# Patient Record
Sex: Male | Born: 1955 | Race: White | Hispanic: No | Marital: Married | Smoking: Never smoker
Health system: Southern US, Community
[De-identification: ages and names within clinical notes are randomized; demographics above are authoritative.]

## PROBLEM LIST (undated history)

## (undated) DIAGNOSIS — I2510 Atherosclerotic heart disease of native coronary artery without angina pectoris: Secondary | ICD-10-CM

## (undated) HISTORY — PX: CORONARY ANGIOPLASTY WITH STENT PLACEMENT: SHX49

---

## 1998-12-28 ENCOUNTER — Encounter: Payer: Self-pay | Admitting: Emergency Medicine

## 1998-12-28 ENCOUNTER — Emergency Department (HOSPITAL_COMMUNITY): Admission: EM | Admit: 1998-12-28 | Discharge: 1998-12-28 | Payer: Self-pay | Admitting: Emergency Medicine

## 2003-05-01 ENCOUNTER — Emergency Department (HOSPITAL_COMMUNITY): Admission: EM | Admit: 2003-05-01 | Discharge: 2003-05-01 | Payer: Self-pay | Admitting: Emergency Medicine

## 2004-09-27 ENCOUNTER — Other Ambulatory Visit: Payer: Self-pay

## 2004-09-27 ENCOUNTER — Inpatient Hospital Stay: Payer: Self-pay | Admitting: Cardiology

## 2004-11-09 ENCOUNTER — Ambulatory Visit: Payer: Self-pay | Admitting: Gastroenterology

## 2005-01-12 ENCOUNTER — Ambulatory Visit: Payer: Self-pay | Admitting: Family Medicine

## 2006-11-28 IMAGING — US ULTRASOUND AORTA
1 series · 10 of 10 positions shown · non-contrast
Comparison: none

REASON FOR EXAM: Family history of AAA
COMMENTS:

PROCEDURE:     US  - US AORTA  - January 12, 2005  [DATE]
RESULT:     The abdominal aorta is visualized and is normal in appearance.
Specifically, no aneurysm formation is seen. No periaortic mass is noted.
The distal abdominal aorta measures approximately 1.7 cm at maximum AP
diameter.

[Series 1: ultrasound aorta · 10 of 10 slices shown]
[im 1/10]
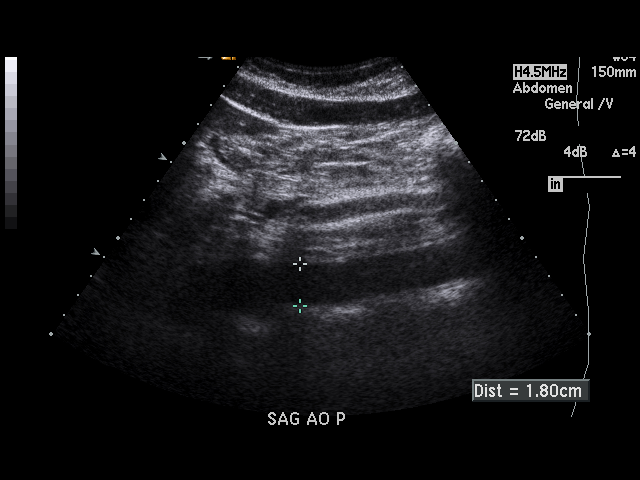
[im 2/10]
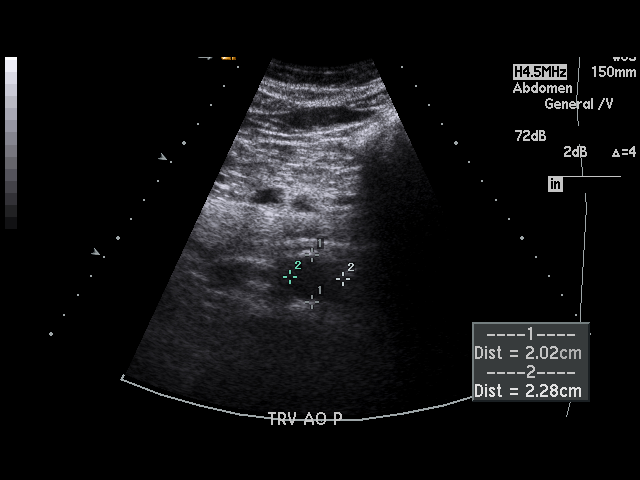
[im 3/10]
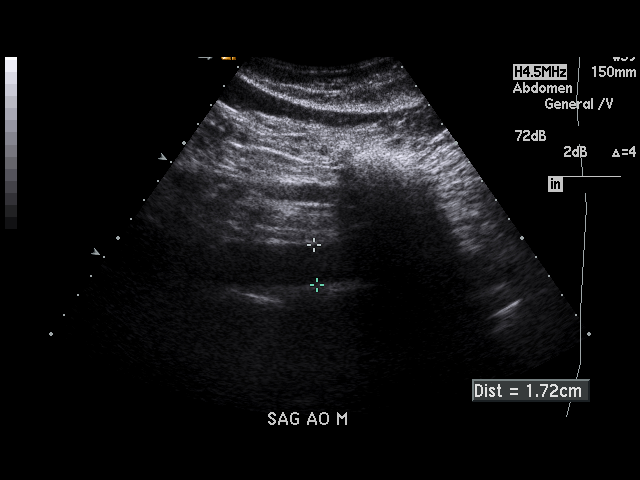
[im 4/10]
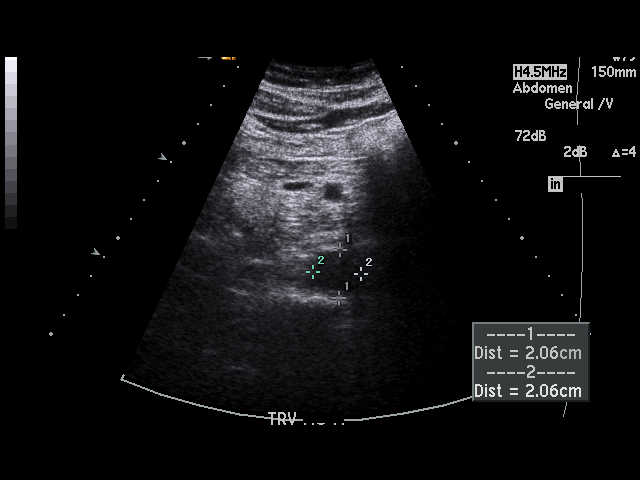
[im 5/10]
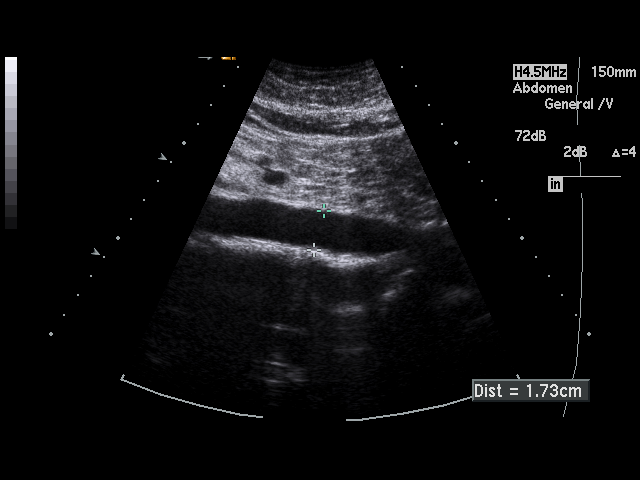
[im 6/10]
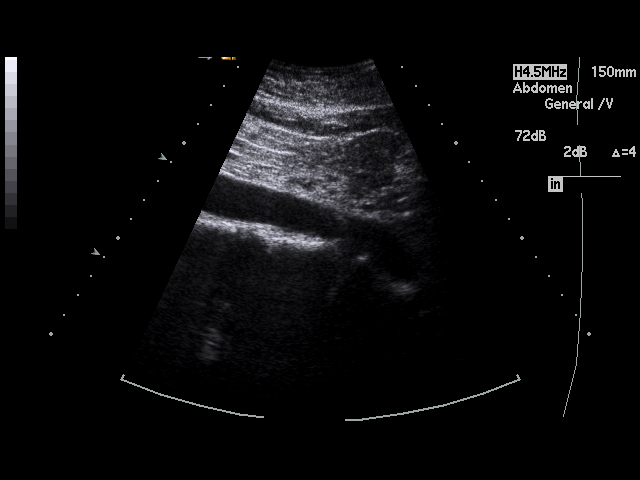
[im 7/10]
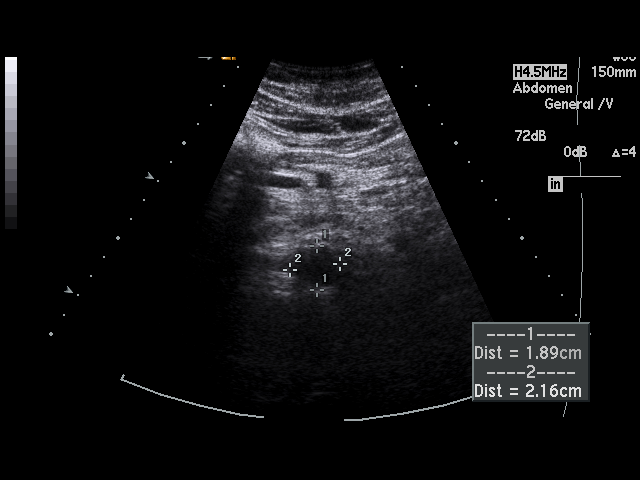
[im 8/10]
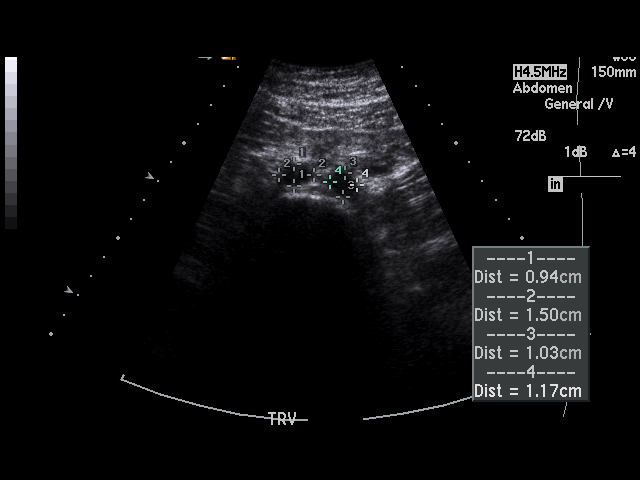
[im 9/10]
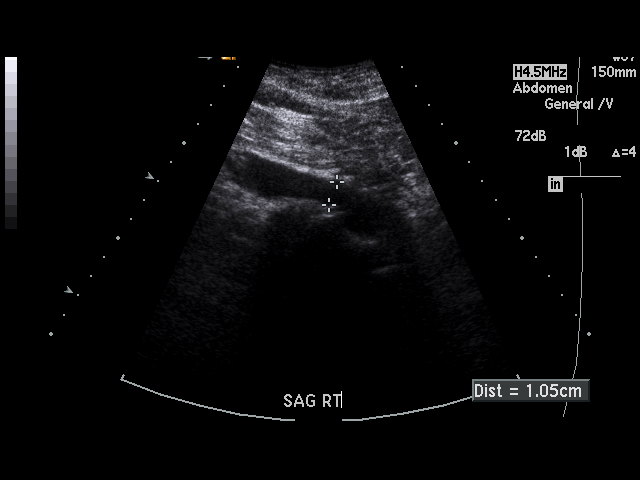
[im 10/10]
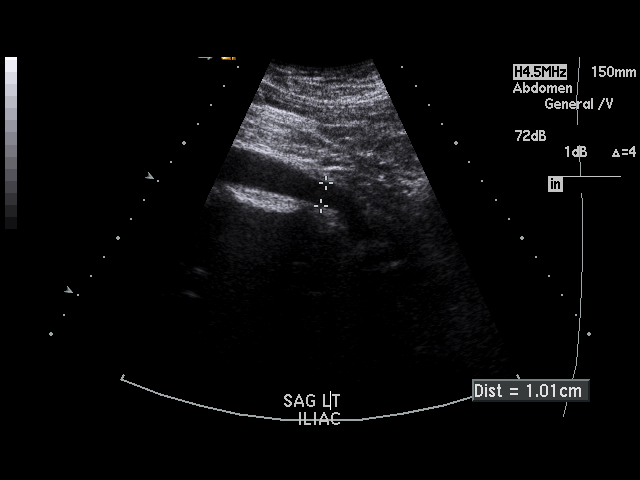

[10 of 10 positions shown; findings below may reference images not displayed]

IMPRESSION: No significant abnormalities are noted.

## 2008-01-07 DIAGNOSIS — I2510 Atherosclerotic heart disease of native coronary artery without angina pectoris: Secondary | ICD-10-CM | POA: Insufficient documentation

## 2008-09-29 ENCOUNTER — Ambulatory Visit: Payer: Self-pay | Admitting: Family Medicine

## 2009-02-09 DIAGNOSIS — E7800 Pure hypercholesterolemia, unspecified: Secondary | ICD-10-CM | POA: Insufficient documentation

## 2009-10-28 ENCOUNTER — Emergency Department: Payer: Self-pay | Admitting: Emergency Medicine

## 2010-02-08 ENCOUNTER — Emergency Department: Payer: Self-pay | Admitting: Emergency Medicine

## 2011-02-19 ENCOUNTER — Encounter (HOSPITAL_COMMUNITY): Payer: Self-pay

## 2011-02-19 ENCOUNTER — Emergency Department (HOSPITAL_COMMUNITY)
Admission: EM | Admit: 2011-02-19 | Discharge: 2011-02-19 | Disposition: A | Discharge status: Home / Self Care | Payer: BC Managed Care – PPO | Attending: Emergency Medicine | Admitting: Emergency Medicine

## 2011-02-19 DIAGNOSIS — H109 Unspecified conjunctivitis: Secondary | ICD-10-CM | POA: Insufficient documentation

## 2011-02-19 DIAGNOSIS — H579 Unspecified disorder of eye and adnexa: Secondary | ICD-10-CM | POA: Insufficient documentation

## 2011-02-19 DIAGNOSIS — H5789 Other specified disorders of eye and adnexa: Secondary | ICD-10-CM | POA: Insufficient documentation

## 2011-02-19 DIAGNOSIS — J3489 Other specified disorders of nose and nasal sinuses: Secondary | ICD-10-CM | POA: Insufficient documentation

## 2011-02-19 DIAGNOSIS — R509 Fever, unspecified: Secondary | ICD-10-CM | POA: Insufficient documentation

## 2011-02-19 DIAGNOSIS — R059 Cough, unspecified: Secondary | ICD-10-CM | POA: Insufficient documentation

## 2011-02-19 DIAGNOSIS — R05 Cough: Secondary | ICD-10-CM | POA: Insufficient documentation

## 2011-02-19 MED ORDER — TOBRAMYCIN 0.3 % OP SOLN
2.0000 [drp] | Freq: Once | OPHTHALMIC | Status: AC
  Administered 2011-02-19: 2 [drp] via OPHTHALMIC
  Filled 2011-02-19: qty 5

## 2011-02-19 NOTE — ED Notes (Signed)
Left eye drainage and itching.

## 2011-02-19 NOTE — ED Notes (Signed)
Presents with redness and swelling to left eye. Pt states he has had a head cold and yesterday eye began draining and itching. NAD at this time. Will continue to monitor.

## 2011-02-19 NOTE — ED Notes (Signed)
Pt a/ox4. Resp even and unlabored. NAD at this time. D/C instructions reviewed with pt. Pt verbalized understanding. Pt ambulated to lobby with steady gate.  

## 2011-02-19 NOTE — ED Provider Notes (Signed)
History     CSN: [id number]  Arrival date & time 02/19/11  1211   First MD Initiated Contact with Patient 02/19/11 1300      Chief Complaint  Patient presents with  . Eye Drainage    (Consider location/radiation/quality/duration/timing/severity/associated sxs/prior treatment) The history is provided by the patient.  pt states last week had uri symptoms w non productive cough, nasal congestion, fever. Those symptoms have improved/resolved. Now states in past 1-2 days, left eye redness, eyelash matting, drainage, irrigation. No severe eye pain. No change in vision. No headache. No known ill contacts. No eye trauma, chemical exposure or fb sensation. No contact use.   History reviewed. No pertinent past medical history.  Past Surgical History  Procedure Date  . Coronary angioplasty with stent placement     No family history on file.  History  Substance Use Topics  . Smoking status: Never Smoker   . Smokeless tobacco: Not on file  . Alcohol Use: No      Review of Systems  Constitutional: Negative for fever.  Eyes: Positive for discharge and itching. Negative for photophobia and pain.  Skin: Negative for rash.  Hematological: Negative for adenopathy.    Allergies  Review of patient's allergies indicates no known allergies.  Home Medications   Current Outpatient Rx  Name Route Sig Dispense Refill  . GUAIFENESIN ER 600 MG PO TB12 Oral Take 1,200 mg by mouth 2 (two) times daily as needed. For congestion    . PSEUDOEPHEDRINE HCL 30 MG PO TABS Oral Take 30 mg by mouth every 4 (four) hours as needed. For congestion      BP 129/88  Pulse 69  Temp(Src) 98.1 F (36.7 C) (Oral)  Resp 22  Ht 5\' 7"  (1.702 m)  Wt 170 lb (77.111 kg)  BMI 26.63 kg/m2  SpO2 98%  Physical Exam  Nursing note and vitals reviewed. Constitutional: He is oriented to person, place, and time. He appears well-developed and well-nourished. No distress.  HENT:  Head: Atraumatic.  Eyes: Pupils are  equal, round, and reactive to light. Right eye exhibits no discharge. Left eye exhibits discharge. No scleral icterus.       Left conj injected, eyelash matting. No orbital or periorbital cellulitis. No corneal clouding. Lids everted, no fb. Eomi, no pain w eom. No rash/vesicles or lesions.   Neck: Neck supple. No tracheal deviation present.  Cardiovascular: Normal rate, normal heart sounds and intact distal pulses.  Exam reveals no gallop and no friction rub.   No murmur heard. Pulmonary/Chest: Effort normal and breath sounds normal. No accessory muscle usage. No respiratory distress.  Musculoskeletal: Normal range of motion.  Neurological: He is alert and oriented to person, place, and time.  Skin: Skin is warm and dry.  Psychiatric: He has a normal mood and affect.    ED Course  Procedures (including critical care time)  Labs Reviewed - No data to display    MDM  tobrex gtts.  Confirmed nkda w pt.        Suzi Roots, MD 02/19/11 1324

## 2011-09-13 IMAGING — CT CT ABD-PELV W/ CM
1 of 2 series · 15 of 32 positions shown, 19 images · non-contrast
Comparison: none

REASON FOR EXAM: (1) right lower quad abdominal pain; (2) right lower
quad abdominal pain
COMMENTS:

PROCEDURE:     CT  - CT ABDOMEN / PELVIS  W  - October 28, 2009  [DATE]
RESULT:     CT abdomen and pelvis dated 10/28/2009.
TECHNIQUE: Helical 3 mm sections were obtained from the lung bases through
the pubic symphysis status post intravenous administration of 100 mL of
Hsovue-MDR and oral contrast.

[Series 2: appendicitis · axial · 0.69mm/px · z∈[+398,+872]mm · 15 of 172 slices shown, 19 images]
[im 7/172  soft-tissue]
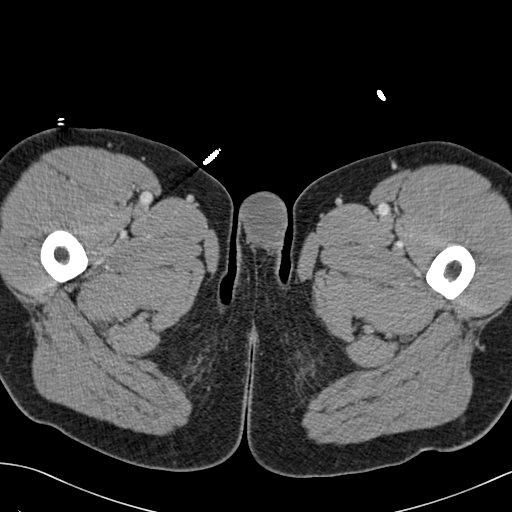
[im 7/172  bone]
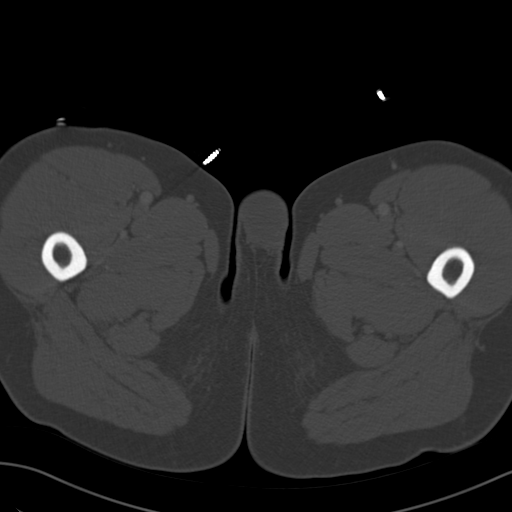
[im 21/172  soft-tissue]
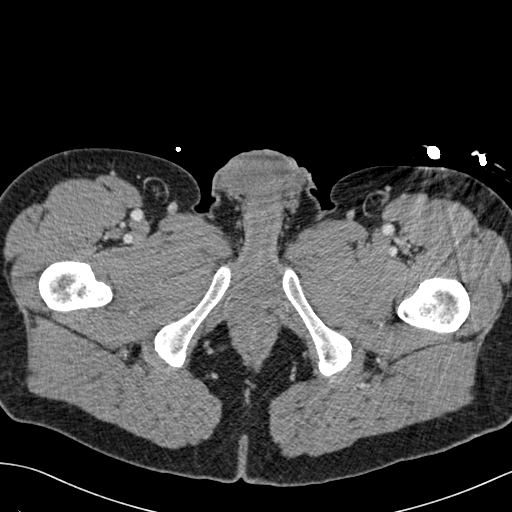
[im 35/172  soft-tissue]
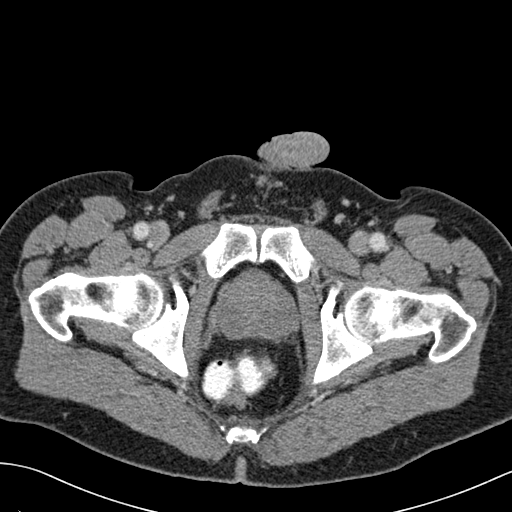
[im 48/172  soft-tissue]
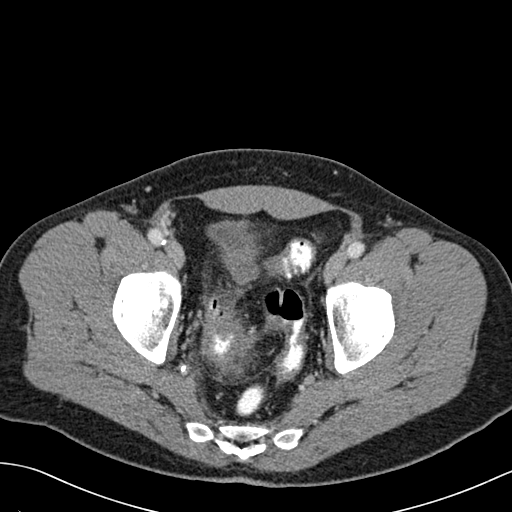
[im 62/172  soft-tissue]
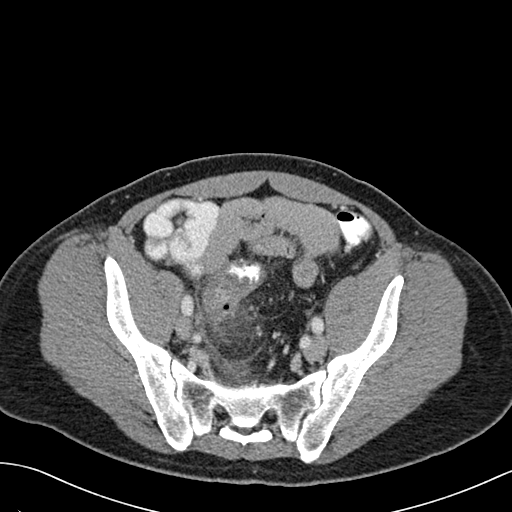
[im 76/172  soft-tissue]
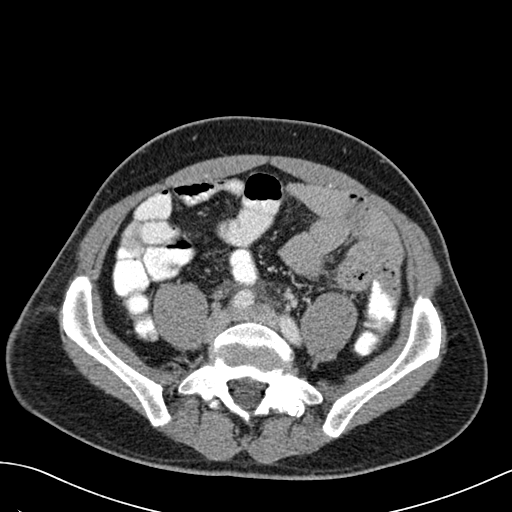
[im 89/172  soft-tissue]
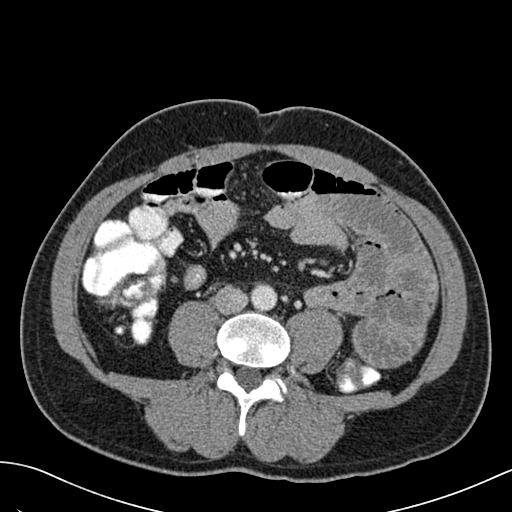
[im 96/172  soft-tissue]
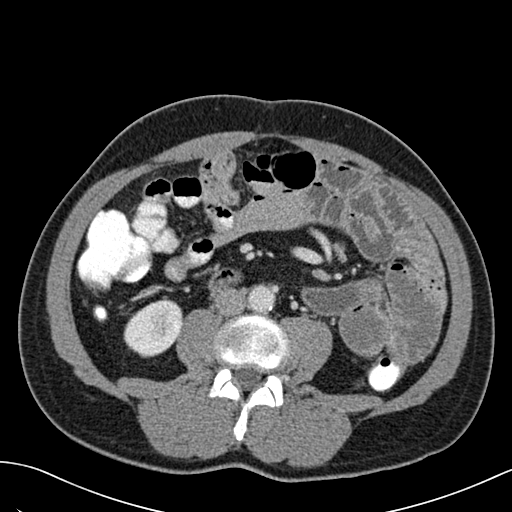
[im 110/172  soft-tissue]
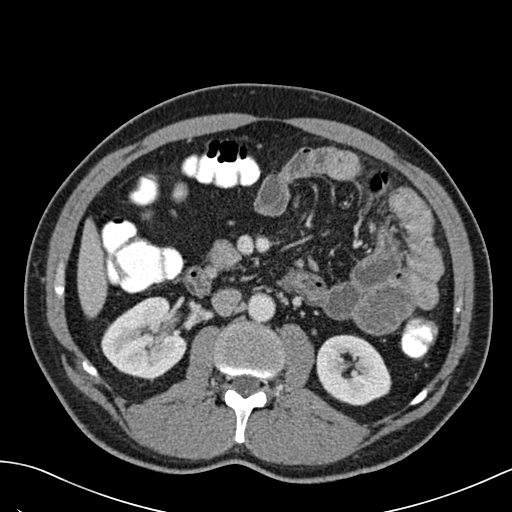
[im 110/172  bone]
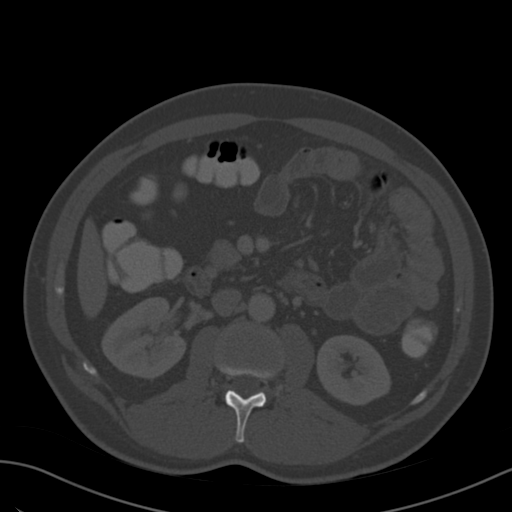
[im 124/172  soft-tissue]
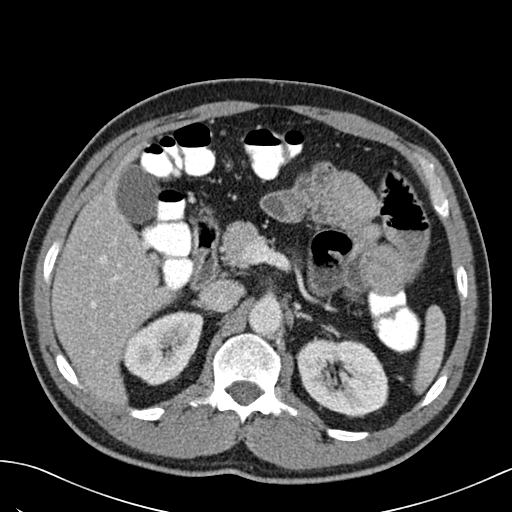
[im 137/172  soft-tissue]
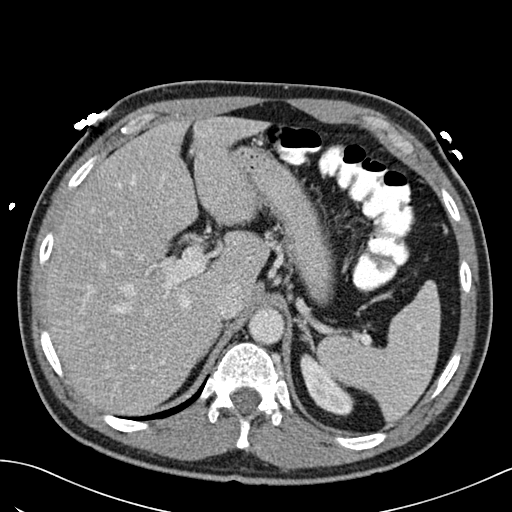
[im 144/172  lung]
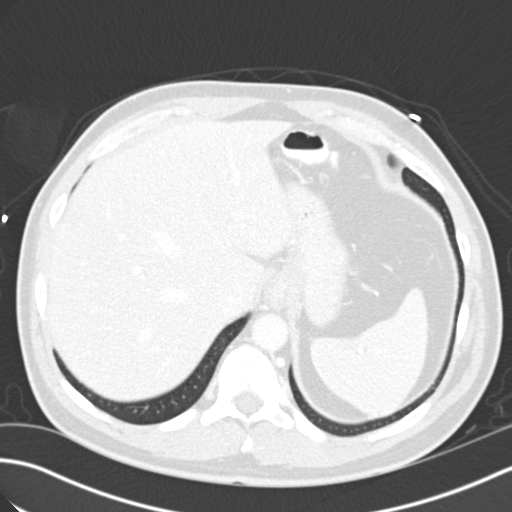
[im 151/172  soft-tissue]
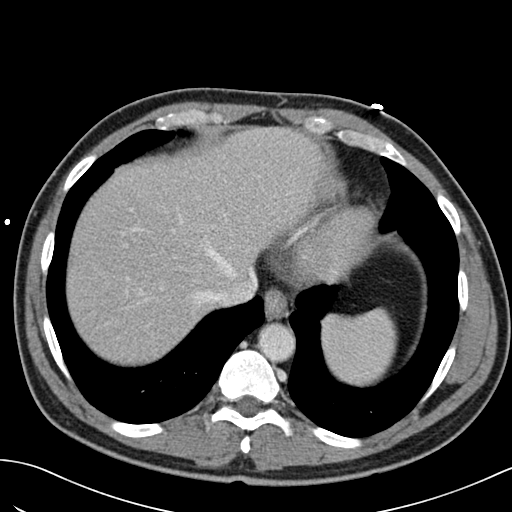
[im 151/172  lung]
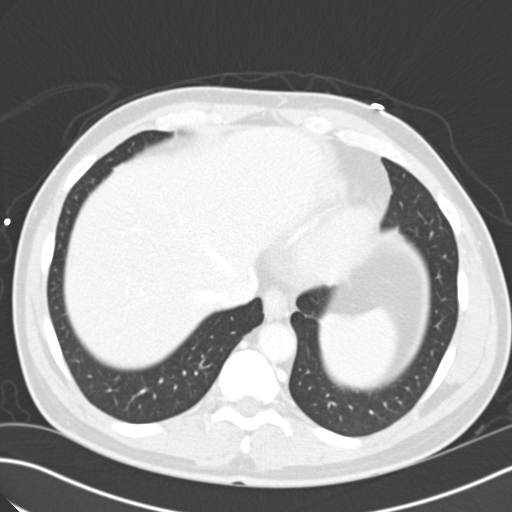
[im 158/172  lung]
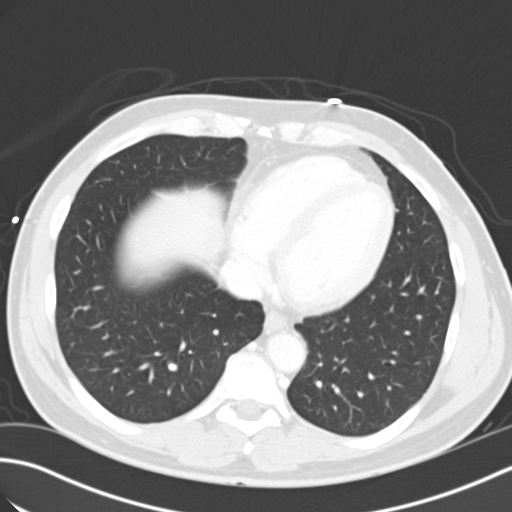
[im 165/172  soft-tissue]
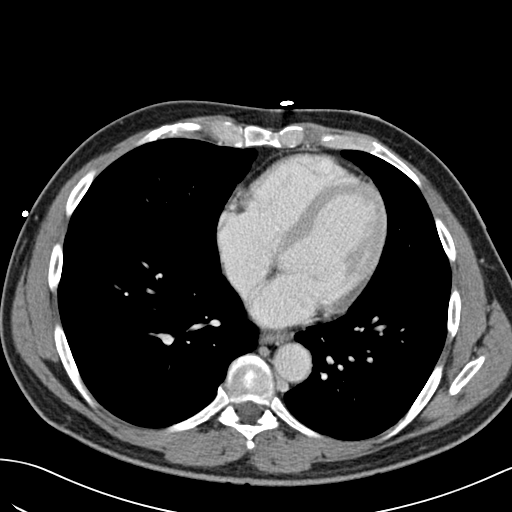
[im 165/172  lung]
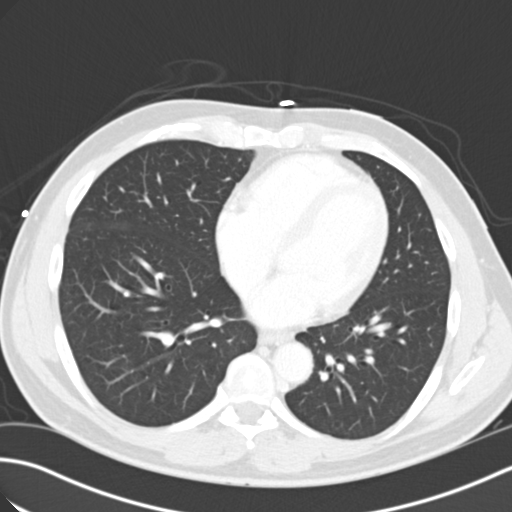

[15 of 32 positions shown; findings below may reference images not displayed]

FINDINGS: The lung bases demonstrate no gross abnormalities.

The liver, spleen, adrenals, pancreas, kidneys are unremarkable.

There is no evidence of abdominal aortic aneurysm. There is no evidence of
abdominal or pelvic masses nor adenopathy.

Within the right paracentral base of the pelvis there is an area of diffuse
bowel wall thickening involving the mid sigmoid colon. Induration within the
surrounding mesenteric fat is appreciated as well as a small amount of free
fluid. No loculated fluid collections are associated with this finding.
There is diverticulosis involving the sigmoid colon which is mild to
moderate. There is no evidence of associated free air. There are
fluid-filled loops of small bowel within the left upper quadrant and areas
of mild bowel wall thickening. There is otherwise no further evidence to
suggest bowel obstruction. There is otherwise no evidence of abdominal or
pelvic masses or adenopathy.
IMPRESSION: 1. CT findings consistent with a diverticulitis involving the sigmoid colon.
Repeat surveillance evaluation is recommended status post appropriate
therapeutic regiment to ensure resolution of the bowel wall thickening.
There is no associated free air nor loculated fluid collections to suggest
an abscess.
2. Findings like representing an ileus [REDACTED]s secondary to the sigmoid
colon findings. A concomitant mild enteritis is also diagnostic concern
infectious versus inflammatory, reactive.
3. Dr. Roberto of the emergency department was informed of these findings at
the time of initial interpretation.

## 2013-09-17 ENCOUNTER — Ambulatory Visit: Payer: Self-pay

## 2013-09-17 ENCOUNTER — Other Ambulatory Visit: Payer: Self-pay | Admitting: Occupational Medicine

## 2013-09-17 DIAGNOSIS — R52 Pain, unspecified: Secondary | ICD-10-CM

## 2015-07-16 DIAGNOSIS — K219 Gastro-esophageal reflux disease without esophagitis: Secondary | ICD-10-CM | POA: Insufficient documentation

## 2015-07-16 DIAGNOSIS — N400 Enlarged prostate without lower urinary tract symptoms: Secondary | ICD-10-CM | POA: Insufficient documentation

## 2015-07-31 DIAGNOSIS — R0602 Shortness of breath: Secondary | ICD-10-CM | POA: Insufficient documentation

## 2016-08-03 DIAGNOSIS — R7989 Other specified abnormal findings of blood chemistry: Secondary | ICD-10-CM | POA: Insufficient documentation

## 2017-10-25 ENCOUNTER — Ambulatory Visit: Admit: 2017-10-25 | Payer: Self-pay | Admitting: Internal Medicine

## 2017-10-25 SURGERY — ESOPHAGOGASTRODUODENOSCOPY (EGD) WITH PROPOFOL
Anesthesia: General

## 2021-08-11 ENCOUNTER — Ambulatory Visit
Admission: EM | Admit: 2021-08-11 | Discharge: 2021-08-11 | Disposition: A | Discharge status: Home / Self Care | Payer: Medicare HMO

## 2021-08-11 ENCOUNTER — Encounter: Payer: Self-pay | Admitting: Emergency Medicine

## 2021-08-11 DIAGNOSIS — S99922A Unspecified injury of left foot, initial encounter: Secondary | ICD-10-CM

## 2021-08-11 DIAGNOSIS — S8991XA Unspecified injury of right lower leg, initial encounter: Secondary | ICD-10-CM

## 2021-08-11 NOTE — Discharge Instructions (Signed)
Go to [facility]! They close at 9pm

## 2021-08-11 NOTE — ED Triage Notes (Signed)
Pt here after twisting right knee after stepping on an uneven surface and catching his left heel today. The right knee is swollen and the left heel is painful.

## 2021-08-11 NOTE — ED Provider Notes (Signed)
Renaldo Fiddler    CSN: [id number] Arrival date & time: 08/11/21  1843      History   Chief Complaint Chief Complaint  Patient presents with  . Knee Pain  . Foot Pain    HPI Johnny Newman is a 66 y.o. male. Pt here after twisting right knee after stepping on an uneven surface and catching his left heel today. The right knee is swollen and the left heel is painful.  Denies falling.  Denies any other injury.  Is able to bear weight but it is painful.   Knee Pain Foot Pain   History reviewed. No pertinent past medical history.  There are no problems to display for this patient.   Past Surgical History:  Procedure Laterality Date  . CORONARY ANGIOPLASTY WITH STENT PLACEMENT         Home Medications    Prior to Admission medications   Medication Sig Start Date End Date Taking? Authorizing Provider  guaiFENesin (MUCINEX) 600 MG 12 hr tablet Take 1,200 mg by mouth 2 (two) times daily as needed. For congestion    [provider]  pseudoephedrine (SUDAFED) 30 MG tablet Take 30 mg by mouth every 4 (four) hours as needed. For congestion    [provider]    Family History History reviewed. No pertinent family history.  Social History Social History   Tobacco Use  . Smoking status: Never  Substance Use Topics  . Alcohol use: No  . Drug use: No     Allergies   Patient has no known allergies.   Review of Systems Review of Systems   Physical Exam Triage Vital Signs ED Triage Vitals  Enc Vitals Group     BP 08/11/21 1857 138/85     Pulse Rate 08/11/21 1857 88     Resp 08/11/21 1857 18     Temp 08/11/21 1857 98.4 F (36.9 C)     Temp src --      SpO2 08/11/21 1857 97 %     Weight --      Height --      Head Circumference --      Peak Flow --      Pain Score 08/11/21 1914 6     Pain Loc --      Pain Edu? --      Excl. in GC? --    No data found.  Updated Vital Signs BP 138/85   Pulse 88   Temp 98.4 F (36.9 C)    Resp 18   SpO2 97%   Visual Acuity Right Eye Distance:   Left Eye Distance:   Bilateral Distance:    Right Eye Near:   Left Eye Near:    Bilateral Near:     Physical Exam Constitutional:      Appearance: Normal appearance.  Pulmonary:     Effort: Pulmonary effort is normal.  Musculoskeletal:     Right knee: Swelling present. No bony tenderness. Normal range of motion. Tenderness present.     Left knee: Normal.     Comments: Laxity and pain with movement of the knee  Neurological:     Mental Status: He is alert.     UC Treatments / Results  Labs (all labs ordered are listed, but only abnormal results are displayed) Labs Reviewed - No data to display  EKG   Radiology No results found.  Procedures Procedures (including critical care time)  Medications Ordered in UC Medications - No data  to display  Initial Impression / Assessment and Plan / UC Course  I have reviewed the triage vital signs and the nursing notes.  Pertinent labs & imaging results that were available during my care of the patient were reviewed by me and considered in my medical decision making (see chart for details).    [facility] has been walking.  Symptomatic left tonight.  Patient directed there for further evaluation.  I do not suspect anything is broken.  I do suspect he may have torn 1 or more ligaments.  Final Clinical Impressions(s) / UC Diagnoses   Final diagnoses:  Knee injury, right, initial encounter  Injury of left heel, initial encounter     Discharge Instructions      Go to [facility] tonight! They close at 9pm   ED Prescriptions   None    PDMP not reviewed this encounter.   Cathlyn Parsons, NP 08/11/21 1954

## 2021-08-30 DIAGNOSIS — M2391 Unspecified internal derangement of right knee: Secondary | ICD-10-CM | POA: Insufficient documentation

## 2021-08-30 DIAGNOSIS — M79672 Pain in left foot: Secondary | ICD-10-CM | POA: Insufficient documentation

## 2021-08-30 DIAGNOSIS — M722 Plantar fascial fibromatosis: Secondary | ICD-10-CM | POA: Insufficient documentation

## 2021-09-07 ENCOUNTER — Ambulatory Visit (INDEPENDENT_AMBULATORY_CARE_PROVIDER_SITE_OTHER): Payer: No Typology Code available for payment source | Admitting: Podiatry

## 2021-09-07 ENCOUNTER — Ambulatory Visit (INDEPENDENT_AMBULATORY_CARE_PROVIDER_SITE_OTHER): Payer: No Typology Code available for payment source

## 2021-09-07 DIAGNOSIS — M722 Plantar fascial fibromatosis: Secondary | ICD-10-CM

## 2021-09-07 MED ORDER — METHYLPREDNISOLONE 4 MG PO TBPK: ORAL_TABLET | ORAL | 0 refills | Status: DC

## 2021-09-07 MED ORDER — MELOXICAM 15 MG PO TABS: 15.0000 mg | ORAL_TABLET | Freq: Every day | ORAL | 1 refills | Status: DC

## 2021-09-07 MED ORDER — BETAMETHASONE SOD PHOS & ACET 6 (3-3) MG/ML IJ SUSP: 3.0000 mg | Freq: Once | INTRAMUSCULAR | Status: AC

## 2021-09-07 NOTE — Progress Notes (Signed)
   Chief Complaint  Patient presents with   Foot Pain    Left heel pain.    Subjective: 66 y.o. male presenting today for evaluation of left heel pain secondary to an injury that occurred while working.  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  DOI: 08/11/2021.  Patient states that he has had pain and tenderness ever since.  He has been wearing a night splint with minimal relief or improvement.  He presents for further treatment and evaluation  No past medical history on file.   Objective: Physical Exam General: The patient is alert and oriented x3 in no acute distress.  Dermatology: Skin is warm, dry and supple bilateral lower extremities. Negative for open lesions or macerations bilateral.   Vascular: Dorsalis Pedis and Posterior Tibial pulses palpable bilateral.  Capillary fill time is immediate to all digits.  Neurological: Epicritic and protective threshold intact bilateral.   Musculoskeletal: Tenderness to palpation to the plantar aspect of the left heel along the plantar fascia. All other joints range of motion within normal limits bilateral. Strength 5/5 in all groups bilateral.   Radiographic exam: Normal osseous mineralization. Joint spaces preserved. No fracture/dislocation/boney destruction. No other soft tissue abnormalities or radiopaque foreign bodies.   Assessment: 1. Plantar fasciitis left foot secondary to work-related injury  Plan of Care:  1. Patient evaluated. Xrays reviewed.   2. Injection of 0.5cc Celestone soluspan injected into the left plantar fascia.  3. Rx for Medrol Dose Pak placed 4. Rx for Meloxicam ordered for patient. 5. Plantar fascial band(s) dispensed  6. Instructed patient regarding therapies and modalities at home to alleviate symptoms.  Continue night splint nightly  7.  Return to clinic in 4 weeks.    [employer] #[id number]---Adjuster is Raiford Noble.   Felecia Shelling, DPM [facility]  Dr. Felecia Shelling, DPM    [address] [place], [place] [address]                Office [phone]  Fax [phone]

## 2021-09-08 ENCOUNTER — Telehealth: Payer: Self-pay | Admitting: Podiatry

## 2021-09-08 NOTE — Telephone Encounter (Signed)
Noted! Thank you

## 2021-09-08 NOTE — Telephone Encounter (Signed)
Patient came in to the office today stating he was seen yesterday by Dr Logan Bores and he spoke with Dr Logan Bores about this being a workmans comp. Patient brought his workmans comp information in for Dr Logan Bores to put in his chart. Patient states he is billing under his insurance. [employer] Claim #[id number]---Adjuster is Raiford Noble.

## 2021-10-05 ENCOUNTER — Ambulatory Visit: Payer: Medicare HMO | Admitting: Podiatry

## 2021-10-05 DIAGNOSIS — M722 Plantar fascial fibromatosis: Secondary | ICD-10-CM | POA: Diagnosis not present

## 2021-10-05 NOTE — Progress Notes (Signed)
   Chief Complaint  Patient presents with   Foot Pain    Patient is here for left heel pain, patient states that he is still having the same pain.    Subjective: 66 y.o. male presenting today for follow-up evaluation of left heel pain secondary to an injury that occurred while working.  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  DOI: 08/11/2021.    Patient continues to have pain and tenderness.  He says the injection dulled the pain for only about 1 day.  He says that around day 3 the pain actually increased.  He continues to have an antalgic gait with pain with ambulation to the left heel.  No past medical history on file.   Objective: Physical Exam General: The patient is alert and oriented x3 in no acute distress.  Dermatology: Skin is warm, dry and supple bilateral lower extremities. Negative for open lesions or macerations bilateral.   Vascular: Dorsalis Pedis and Posterior Tibial pulses palpable bilateral.  Capillary fill time is immediate to all digits.  Neurological: Epicritic and protective threshold intact bilateral.   Musculoskeletal: There continues to be moderate to severe tenderness to palpation to the plantar aspect of the left heel along the plantar fascia. All other joints range of motion within normal limits bilateral. Strength 5/5 in all groups bilateral.   Radiographic exam LT foot 09/07/2021: Normal osseous mineralization. Joint spaces preserved. No fracture/dislocation/boney destruction. No other soft tissue abnormalities or radiopaque foreign bodies.   Assessment: 1. Plantar fasciitis left foot secondary to work-related injury  Plan of Care:  1. Patient evaluated.  2.  The patient did not get any significant relief from the cortisone injection or prednisone pack or plantar fascial bracing 3.   Continue the meloxicam 15 mg daily 4.  For now we are going to place the patient in the immobilization cam boot x3 weeks.  Weightbearing as tolerated. 5.  Return to clinic in 3 weeks.  If there is improvement and reduction of pain and inflammation we will send the patient to physical therapy.   [employer] #[id number]---Adjuster is Johnny Newman.   Johnny Newman, DPM [facility]  Dr. Edrick Newman, DPM    [address], [place] [address]                Office [phone]  Fax [phone]

## 2021-10-26 ENCOUNTER — Ambulatory Visit (INDEPENDENT_AMBULATORY_CARE_PROVIDER_SITE_OTHER): Payer: No Typology Code available for payment source | Admitting: Podiatry

## 2021-10-26 DIAGNOSIS — M722 Plantar fascial fibromatosis: Secondary | ICD-10-CM | POA: Diagnosis not present

## 2021-10-26 NOTE — Progress Notes (Signed)
   Chief Complaint  Patient presents with   Follow-up    Patient is her for follow-up left foot plantar fasciitis.Patient states that he is still having some pain.    Subjective: 66 y.o. male presenting today for follow-up evaluation of left heel pain secondary to an injury that occurred while working.  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  DOI: 08/11/2021.    Patient continues to have pain and tenderness.  Patient states that he wore the immobilization cam boot the majority of the time over the past 3 weeks.  He continues to have some pain and tenderness to the heel.  Only slight improvement.  He also takes the meloxicam daily.  No new complaints at this time  No past medical history on file.   Objective: Physical Exam General: The patient is alert and oriented x3 in no acute distress.  Dermatology: Skin is warm, dry and supple bilateral lower extremities. Negative for open lesions or macerations bilateral.   Vascular: Dorsalis Pedis and Posterior Tibial pulses palpable bilateral.  Capillary fill time is immediate to all digits.  Neurological: Epicritic and protective threshold intact bilateral.   Musculoskeletal: There continues to be moderate to severe tenderness to palpation to the plantar aspect of the left heel along the plantar fascia. All other joints range of motion within normal limits bilateral. Strength 5/5 in all groups bilateral.   Radiographic exam LT foot 09/07/2021: Normal osseous mineralization. Joint spaces preserved. No fracture/dislocation/boney destruction. No other soft tissue abnormalities or radiopaque foreign bodies.   Assessment: 1. Plantar fasciitis left foot secondary to work-related injury  Plan of Care:  1. Patient evaluated.  2.  Overall the patient has only had minimal relief with  the immobilization in the cam boot. 3.  Today we are going to initiate physical therapy.  Order placed for physical therapy at [facility] 4.  Continue meloxicam 15 mg daily 5.  Patient may transition out of the cam boot into good supportive tennis shoes 6.  Return to clinic 6 weeks   [place] #[id number]---Adjuster is Lu Duffel.   Edrick Kins, DPM [facility]  Dr. Edrick Kins, DPM    [address], [place] [address]                Office [phone]  Fax [phone]

## 2021-11-07 ENCOUNTER — Other Ambulatory Visit: Payer: Self-pay | Admitting: Podiatry

## 2022-01-05 ENCOUNTER — Other Ambulatory Visit: Payer: Self-pay | Admitting: Podiatry

## 2022-01-14 ENCOUNTER — Ambulatory Visit (INDEPENDENT_AMBULATORY_CARE_PROVIDER_SITE_OTHER): Payer: No Typology Code available for payment source | Admitting: Podiatry

## 2022-01-14 DIAGNOSIS — M722 Plantar fascial fibromatosis: Secondary | ICD-10-CM

## 2022-01-14 NOTE — Progress Notes (Unsigned)
Minimally better after pt at [facility] left heel Rtc after mri    Chief Complaint  Patient presents with   Follow-up    Minimal improvement after PT     Subjective: 67 y.o. male presenting today for follow-up evaluation of left heel pain secondary to an injury that occurred while working.  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  DOI: 08/11/2021.    Patient continues to have pain and tenderness.  He completed physical therapy but there has been minimal improvement. Presenting for further treatment and evaluation.  No past medical history on file.  Past Surgical History:  Procedure Laterality Date   CORONARY ANGIOPLASTY WITH STENT PLACEMENT     No Known Allergies  Objective: Physical Exam General: The patient is alert and oriented x3 in no acute distress.  Dermatology: Skin is warm, dry and supple bilateral lower extremities. Negative for open lesions or macerations bilateral.   Vascular: Dorsalis Pedis and Posterior Tibial pulses palpable bilateral.  Capillary fill time is immediate to all digits.  Neurological: Light touch and protective threshold intact bilateral.   Musculoskeletal: There continues to be moderate to severe tenderness to palpation to the plantar aspect of the left heel along the plantar fascia. All other joints range of motion within normal limits bilateral. Strength 5/5 in all groups bilateral.   Radiographic exam LT foot 09/07/2021: Normal osseous mineralization. Joint spaces preserved. No fracture/dislocation/boney destruction. No other soft tissue abnormalities or radiopaque foreign bodies.   Assessment: 1. Plantar fasciitis left foot secondary to work-related injury -Patient evaluated -Patient has not completed physical therapy and there has been minimal improvement or relief of  symptoms.  He continues to have pain on a daily basis -Continue meloxicam 15 mg daily as needed -Patient may now discontinue the immobilization cam boot -Order placed for MRI left heel without contrast -Return to clinic after MRI to review results and discuss further treatment options  [employer] Claim #[id number]---Adjuster is [employer].   Edrick Kins, DPM [facility]  Dr. Edrick Kins, DPM    [address], [place] [address]                Office [phone]  Fax [phone]

## 2022-02-17 ENCOUNTER — Ambulatory Visit
Admission: RE | Admit: 2022-02-17 | Discharge: 2022-02-17 | Disposition: A | Discharge status: Home / Self Care | Payer: No Typology Code available for payment source | Source: Ambulatory Visit | Attending: Podiatry | Admitting: Podiatry

## 2022-02-17 DIAGNOSIS — M722 Plantar fascial fibromatosis: Secondary | ICD-10-CM

## 2022-02-22 ENCOUNTER — Ambulatory Visit (INDEPENDENT_AMBULATORY_CARE_PROVIDER_SITE_OTHER): Payer: No Typology Code available for payment source | Admitting: Podiatry

## 2022-02-22 DIAGNOSIS — M722 Plantar fascial fibromatosis: Secondary | ICD-10-CM

## 2022-02-22 NOTE — Progress Notes (Signed)
Chief Complaint  Patient presents with   Plantar Fasciitis    Patient came in today for a left foot plantar fasciitis follow-up, MRI Results,     Subjective: 67 y.o. male presenting today for follow-up evaluation of left heel pain secondary to an injury that occurred while working.  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  DOI: 08/11/2021.    Patient continues to have pain and tenderness.  He completed physical therapy but there has been minimal improvement.  Patient continues to have pain or tenderness on a daily basis.  Presenting today to review the MRI results and discuss further treatment options   No past medical history on file.  Past Surgical History:  Procedure Laterality Date   CORONARY ANGIOPLASTY WITH STENT PLACEMENT     No Known Allergies  Objective: Physical Exam General: The patient is alert and oriented x3 in no acute distress.  Dermatology: Skin is warm, dry and supple bilateral lower extremities. Negative for open lesions or macerations bilateral.   Vascular: Dorsalis Pedis and Posterior Tibial pulses palpable bilateral.  Capillary fill time is immediate to all digits.  Neurological: Light touch and protective threshold intact bilateral.   Musculoskeletal: There continues to be moderate to severe tenderness to palpation to the plantar aspect of the left heel along the plantar fascia. All other joints range of motion within normal limits bilateral. Strength 5/5 in all groups bilateral.   Radiographic exam LT foot 09/07/2021: Normal osseous mineralization. Joint spaces preserved. No fracture/dislocation/boney destruction. No other soft tissue abnormalities or radiopaque foreign bodies.   MR HEEL LEFT WO CONTRAST 02/17/2022 Plantar Fascia: Severe thickening of the medial band of the plantar fascia  with increased signal consistent with severe plantar fasciitis with a partial-thickness tear towards the calcaneal insertion. IMPRESSION: 1. Severe plantar fasciitis of the medial band of the plantar fascia with a partial-thickness tear towards the calcaneal insertion. 2. Mild tendinosis of the peroneus brevis. 3. High-grade partial-thickness cartilage loss of the medial aspect of the tibial plafond and with subchondral cystic changes. High-grade partial-thickness cartilage loss of the lateral tibiotalar joint with subchondral reactive marrow edema.  Assessment: 1. Plantar fasciitis left foot secondary to work-related injury -Patient evaluated.  MRI reviewed - Unfortunate the patient continues to have pain and tenderness to the plantar heel for over 6 months.  Conservative treatments have been unsuccessful in providing any sort of lasting alleviation of symptoms for the patient.  I do believe it is appropriate at this time to discuss surgical endoscopic plantar fasciotomy to release the plantar fascia and allow for healing.  Risk benefits advantages and disadvantages were explained in detail to the patient.  No guarantees were expressed or implied.  This operative recovery course was also explained.  He will be WBAT cam boot x 3-4 weeks and then slowly transition back into good supportive tennis shoes or sneakers -Authorization for surgery was initiated today.  Surgery will consist of endoscopic plantar fasciotomy left with possible injectable amnion administration -Return to clinic 1 week postop  [place] Claim #[id number]---Adjuster is Lu Duffel.   Edrick Kins, DPM [facility]  Dr. Edrick Kins, DPM    [address]  [place], [place] [address]                Office [phone]  Fax [phone]

## 2022-03-16 ENCOUNTER — Other Ambulatory Visit: Payer: Self-pay | Admitting: Podiatry

## 2022-04-01 ENCOUNTER — Telehealth: Payer: Self-pay | Admitting: Urology

## 2022-04-01 NOTE — Telephone Encounter (Signed)
DOS - 04/14/22  EPF LEFT --- [id number]  WORKERS COMP  SPOKE WITH DEREK JOHNSON [employer] ADJUSTER HE STATED THAT PT HAS BEEN APPROVED TO HAVE SURGERY. HE WILL BE FAXING OVER PAPERS THAT I WILL THEN EMAIL TO [place] WITH [place].  CLAIM # [id number] - 0001  CALL REF # DEREK 04/01/22 AT 9:23 AM EST

## 2022-04-14 ENCOUNTER — Other Ambulatory Visit: Payer: Self-pay | Admitting: Podiatry

## 2022-04-14 DIAGNOSIS — M722 Plantar fascial fibromatosis: Secondary | ICD-10-CM

## 2022-04-14 HISTORY — PX: OTHER SURGICAL HISTORY: SHX169

## 2022-04-14 MED ORDER — MELOXICAM 15 MG PO TABS: 15.0000 mg | ORAL_TABLET | Freq: Every day | ORAL | 1 refills | Status: DC

## 2022-04-14 MED ORDER — OXYCODONE-ACETAMINOPHEN 5-325 MG PO TABS: 1.0000 | ORAL_TABLET | ORAL | 0 refills | Status: DC | PRN

## 2022-04-14 NOTE — Progress Notes (Signed)
PRN postop 

## 2022-04-19 ENCOUNTER — Ambulatory Visit (INDEPENDENT_AMBULATORY_CARE_PROVIDER_SITE_OTHER): Payer: No Typology Code available for payment source | Admitting: Podiatry

## 2022-04-19 ENCOUNTER — Encounter: Payer: Self-pay | Admitting: Podiatry

## 2022-04-19 VITALS — BP 152/87 | HR 58

## 2022-04-19 DIAGNOSIS — Z9889 Other specified postprocedural states: Secondary | ICD-10-CM

## 2022-04-19 NOTE — Progress Notes (Signed)
   Chief Complaint  Patient presents with   Routine Post Op    "It's fine."    Subjective:  Patient presents today status post EPF left foot.  DOS: 04/14/2022.  Patient presents today with WorkersComp Rep Marcha Dutton RN BSN QRP, Marvetta Gibbons.  Patient doing well.  Not taking any pain medication.  Dressings are clean dry and intact and he is WBAT cam boot  No past medical history on file.  Past Surgical History:  Procedure Laterality Date   CORONARY ANGIOPLASTY WITH STENT PLACEMENT      No Known Allergies  Objective/Physical Exam Neurovascular status intact.  Incisions well coapted with sutures intact. No sign of infectious process noted. No dehiscence. No active bleeding noted.  Moderate edema noted to the surgical extremity.  Assessment: 1. s/p EPF left. DOS: 04/14/2022  -Patient evaluated -Dressings changed.  Patient may begin washing and showering and getting the foot wet -Continue minimal WBAT cam boot -Note for work was provided today for sedentary type work only.  I would prefer the patient not to return to work until incisions have healed completely and sutures are removed.  He has a follow-up appointment with me in 2 weeks, 05/03/2022.  He may begin return to work sedentary work only beginning 05/04/2022 -Return to clinic 2 weeks suture removal  *WorkersComp Rep [employer] RN BSN QRP, Mikael Spray, DPM [facility]  Dr. Felecia Shelling, DPM    [address] [place], [place] [address]                Office [phone]  Fax [phone]

## 2022-04-22 ENCOUNTER — Encounter: Payer: Medicare HMO | Admitting: Podiatry

## 2022-04-29 ENCOUNTER — Encounter: Payer: Medicare HMO | Admitting: Podiatry

## 2022-05-03 ENCOUNTER — Encounter: Payer: Self-pay | Admitting: Podiatry

## 2022-05-03 ENCOUNTER — Ambulatory Visit (INDEPENDENT_AMBULATORY_CARE_PROVIDER_SITE_OTHER): Payer: No Typology Code available for payment source | Admitting: Podiatry

## 2022-05-03 DIAGNOSIS — Z9889 Other specified postprocedural states: Secondary | ICD-10-CM

## 2022-05-03 DIAGNOSIS — M722 Plantar fascial fibromatosis: Secondary | ICD-10-CM

## 2022-05-03 NOTE — Progress Notes (Signed)
   Chief Complaint  Patient presents with   Routine Post Op    POV # 2 DOS 04/14/22 --- EPF LEFT WITH POSSIBLE AMNIOTIC STEM CELL INJECTION, patient has some pain, rate of pain 3 out of 10, NO N/V/F/C/SOB, TX: CAM Boot, Sutures were removed today     Subjective:  Patient presents today status post EPF left foot.  DOS: 04/14/2022.  Patient presents today with WorkersComp Rep Marcha Dutton RN BSN QRP, Marvetta Gibbons.  Patient continues to have some pain and tenderness associated to the heel.  He is WBAT cam boot.  No new complaints  No past medical history on file.  Past Surgical History:  Procedure Laterality Date   CORONARY ANGIOPLASTY WITH STENT PLACEMENT      No Known Allergies  Objective/Physical Exam Neurovascular status intact.  Incisions healed with sutures intact.  There continues to be some sensitivity and tenderness especially to the lateral aspect of the heel.  No edema noted.  Muscle strength 5/5 all compartments  Assessment: 1. s/p EPF left. DOS: 04/14/2022  -Patient evaluated.  Sutures removed - Order placed for physical therapy -Patient may now begin to transition out of cam boot into good supportive shoes and sneakers -Continue to advise against when barefoot. -Continue NSAIDs daily -Return to clinic 6 weeks  *WorkersComp Rep Marcha Dutton RN BSN QRP, Mikael Spray, DPM [facility]  Dr. Felecia Shelling, DPM    [address] [place], [place] [address]                Office [phone]  Fax [phone]

## 2022-05-13 ENCOUNTER — Encounter: Payer: Medicare HMO | Admitting: Podiatry

## 2022-05-17 ENCOUNTER — Encounter: Payer: Medicare HMO | Admitting: Podiatry

## 2022-06-14 ENCOUNTER — Encounter: Payer: Self-pay | Admitting: Podiatry

## 2022-06-14 ENCOUNTER — Ambulatory Visit (INDEPENDENT_AMBULATORY_CARE_PROVIDER_SITE_OTHER): Payer: No Typology Code available for payment source | Admitting: Podiatry

## 2022-06-14 VITALS — BP 139/84 | HR 65

## 2022-06-14 DIAGNOSIS — Z9889 Other specified postprocedural states: Secondary | ICD-10-CM

## 2022-06-14 NOTE — Progress Notes (Signed)
   Chief Complaint  Patient presents with   Routine Post Op    "It's sore, I think we're at a plateau at this time."    Subjective:  Patient presents today status post EPF left foot.  DOS: 04/14/2022.  Patient presents today with WorkersComp Rep Marcha Dutton RN BSN QRP, Marvetta Gibbons.  Patient states that he is about 30% improved since surgery.  Currently WBAT in good supportive tennis shoes and sneakers.  He has been going to physical therapy.  He feels as if he is hit somewhat of a plateau.  Pain is tolerable but he does have intermittent pain and tenderness depending on activity.  No past medical history on file.  Past Surgical History:  Procedure Laterality Date   CORONARY ANGIOPLASTY WITH STENT PLACEMENT      No Known Allergies  Objective/Physical Exam Neurovascular status intact.  Incisions nicely healed.  There continues to be some slight tenderness and sensitivity to the plantar heel.  Good muscle strength 5/5 all compartments.  No edema or erythema noted.  Assessment: 1. s/p EPF left. DOS: 04/14/2022  -Patient evaluated.  Sutures removed - Continue physical therapy -Continue wearing good supportive tennis shoes and sneakers -Continue to advise against when barefoot. -Continue NSAIDs daily -Return to clinic 6 weeks  *WorkersComp Rep Marcha Dutton RN BSN QRP, Mikael Spray, DPM [facility]  Dr. Felecia Shelling, DPM    [address] [place], [place] [address]                Office [phone]  Fax [phone]

## 2022-07-11 ENCOUNTER — Other Ambulatory Visit: Payer: Self-pay | Admitting: Podiatry

## 2022-07-26 ENCOUNTER — Ambulatory Visit (INDEPENDENT_AMBULATORY_CARE_PROVIDER_SITE_OTHER): Payer: No Typology Code available for payment source | Admitting: Podiatry

## 2022-07-26 ENCOUNTER — Encounter: Payer: Self-pay | Admitting: Podiatry

## 2022-07-26 VITALS — BP 151/80 | HR 54

## 2022-07-26 DIAGNOSIS — M722 Plantar fascial fibromatosis: Secondary | ICD-10-CM | POA: Diagnosis not present

## 2022-07-26 NOTE — Progress Notes (Signed)
   Chief Complaint  Patient presents with   Routine Post Op    "No change"    Subjective:  Patient presents today status post EPF left foot.  DOS: 04/14/2022.  Patient presents today with WorkersComp Rep Marcha Dutton RN BSN QRP, Marvetta Gibbons.  Mostly unchanged.  Continues to experience pain and tenderness to the foot.  He has completed physical therapy.  Currently WBAT in good supportive tennis shoes and boots.  He has also tried orthotics in the past with no improvement.  Patient has been experiencing lateral column foot pain as well and admits to compensating and walking towards the outside of his foot because of plantar fasciitis  No past medical history on file.  Past Surgical History:  Procedure Laterality Date   CORONARY ANGIOPLASTY WITH STENT PLACEMENT      No Known Allergies  Objective/Physical Exam Neurovascular status intact.  Incisions nicely healed.  Continues to be tenderness to the plantar heel and along the lateral column of the foot secondary to compensation.  Muscle strength 5/5 all compartments.  Assessment: 1. s/p EPF left. DOS: 04/14/2022  -Patient evaluated.   -Patient has completed physical therapy -Continue wearing good supportive tennis shoes and sneakers -Continue to advise against when barefoot. -Continue NSAIDs daily as needed -Return to clinic 6 weeks  *WorkersComp Rep Marcha Dutton RN BSN QRP, Mikael Spray, DPM [facility]  Dr. Felecia Shelling, DPM    [address] [place], [place] [address]                Office [phone]  Fax [phone]

## 2022-09-06 ENCOUNTER — Encounter: Payer: Self-pay | Admitting: Podiatry

## 2022-09-06 ENCOUNTER — Ambulatory Visit (INDEPENDENT_AMBULATORY_CARE_PROVIDER_SITE_OTHER): Payer: No Typology Code available for payment source | Admitting: Podiatry

## 2022-09-06 DIAGNOSIS — M722 Plantar fascial fibromatosis: Secondary | ICD-10-CM

## 2022-09-06 NOTE — Progress Notes (Signed)
   Chief Complaint  Patient presents with   Routine Post Op    s/p EPF left. DOS: 04/14/2022 "It went from acute to chronic.  I knew it was going to happen because I'm old."    Subjective:  Patient presents today status post EPF left foot.  DOS: 04/14/2022.  Patient presents today with WorkersComp Rep Marcha Dutton RN BSN QRP, Marvetta Gibbons.  Mostly unchanged.  Continues to experience pain and tenderness to the foot.  He has completed physical therapy.  Currently WBAT in good supportive tennis shoes and boots. Patient has been experiencing lateral column foot pain as well and admits to compensating and walking towards the outside of his foot because of plantar fasciitis  No past medical history on file.  Past Surgical History:  Procedure Laterality Date   CORONARY ANGIOPLASTY WITH STENT PLACEMENT      No Known Allergies  Objective/Physical Exam Neurovascular status intact.  Incisions nicely healed.  Continues to be tenderness to the plantar heel and along the lateral column of the foot.  Muscle strength 5/5 all compartments.  Assessment: 1. s/p EPF left. DOS: 04/14/2022 2.  DJD with arthritis right knee  -Patient evaluated.  Patient states that he believes he is compensating and relying on his left lower extremity because of his right knee pain.  Orthopedics has essentially signed off on him stating that there is nothing further that can be done other than knee replacement -Patient has completed physical therapy -Continue wearing good supportive tennis shoes and sneakers -Continue NSAIDs daily as needed - Unfortunately the patient continues to have some pain and tenderness around the heel.  He states that his initial pain extending into the arch and midfoot has resolved because of the surgery but he is now having "horseshoe" pain around his heel.  -I do believe the patient would benefit from custom molded orthotics at this time.  This will help support the medial longitudinal arch of the foot and  potentially alleviate pressure and pain from the heel -Order placed for custom molded orthotics and given to Angelique Blonder, [employer]. for approval -From a surgical standpoint I am okay if the patient resumes full activity without restrictions as tolerated -I would like to see the patient approximately 3 months after he has been wearing his orthotics to see if there is any improvement.  If there is no improvement may need to consider MRI  *WorkersComp Rep Marcha Dutton RN BSN QRP, Mikael Spray, DPM [facility]  Dr. Felecia Shelling, DPM    [address] [place], [place] [address]                Office [phone]  Fax [phone]

## 2022-09-11 ENCOUNTER — Other Ambulatory Visit: Payer: Self-pay | Admitting: Podiatry

## 2022-10-06 ENCOUNTER — Telehealth: Payer: Self-pay | Admitting: Physician Assistant

## 2022-10-06 NOTE — Telephone Encounter (Signed)
Patient called in to schedule his office visit. With Celso Amy the PA for 11/09/2022. The patient to verify and update the information in her chart, including the insurance guarantor and other relevant details and scheduled an appointment. I sent out an appointment reminder with a No-Show letter and a map. I advise her of her $20 Co-Pay.

## 2022-11-07 ENCOUNTER — Other Ambulatory Visit: Payer: Self-pay

## 2022-11-08 NOTE — Progress Notes (Unsigned)
Celso Amy, PA-C [address]  [address]  [place], [place] [address]  Main: [phone]  Fax: [phone]   Gastroenterology Consultation  Referring Provider:     Lorn Junes, FNP Primary Care Physician:  Lorn Junes, FNP Primary Gastroenterologist:  Celso Amy, PA-C / Dr. Wyline Mood   Reason for Consultation:     GERD, dysphagia        HPI:   Johnny Newman is a 67 y.o. y/o male referred for consultation & management  by Lorn Junes, FNP.    Referred to evaluate GERD.  Currently taking Prilosec 20 mg once daily.  Also takes OTC Rolaids antacid as needed.  He does not take Prilosec every day.  Only takes it when he has acid reflux.  Has history of GERD for 30 years.  He has noticed food getting stuck in his mid lower esophagus and epigastrium for 14 years.  Solid food dysphagia x 14 years.  Occasionally has to vomit food back up.  Has associated hiccups when this happens.  Sometimes drinking water causes acid reflux and burning.  He is concerned about possible hiatal hernia.  He denies unintentional weight loss.  Was previously seen at [facility] to evaluate dysphagia in 2019.  Was scheduled for EGD, however procedure was canceled.  He has never had an EGD or colonoscopy.  He denies constipation or rectal bleeding.  He had an episode of diarrhea 1 month ago which lasted 1 week and resolved.  History reviewed. No pertinent past medical history.  Past Surgical History:  Procedure Laterality Date   CORONARY ANGIOPLASTY WITH STENT PLACEMENT      Prior to Admission medications   Medication Sig Start Date End Date Taking? Authorizing Provider  Cholecalciferol (D 1000) 25 MCG (1000 UT) capsule  06/06/19   [provider]  cyanocobalamin (VITAMIN B12) 1000 MCG tablet Take by mouth.    [provider]  Docusate Sodium (DSS) 100 MG CAPS Take 1 tablet by mouth 3 (three) times daily as needed.    [provider]   hydrochlorothiazide (MICROZIDE) 12.5 MG capsule Take 12.5 mg by mouth daily.    [provider]  losartan (COZAAR) 25 MG tablet Take 25 mg by mouth daily.    [provider]  losartan-hydrochlorothiazide (HYZAAR) 100-12.5 MG tablet Take 1 tablet by mouth daily.    [provider]  meloxicam (MOBIC) 15 MG tablet TAKE 1 TABLET (15 MG TOTAL) BY MOUTH DAILY. 09/11/22   Standiford, Jenelle Mages, DPM  Multiple Vitamin (MULTI-VITAMIN) tablet Take by mouth.    [provider]  omeprazole (PRILOSEC) 20 MG capsule OMEPRAZOLE DR 20 MG CAPSULE 07/23/18   [provider]  Saw Palmetto 450 MG CAPS Take by mouth.    [provider]    History reviewed. No pertinent family history.   Social History   Tobacco Use   Smoking status: Never  Substance Use Topics   Alcohol use: No   Drug use: No    Allergies as of 11/09/2022   (No Known Allergies)    Review of Systems:    All systems reviewed and negative except where noted in HPI.   Physical Exam:  BP (!) 141/76   Pulse 66   Temp 98.1 F (36.7 C)   Ht 5\' 8"  (1.727 m)   Wt 191 lb (86.6 kg)   BMI 29.04 kg/m  No LMP for male patient.  General:   Alert,  Well-developed, well-nourished, pleasant and cooperative in NAD Lungs:  Respirations even and unlabored.  Clear throughout to auscultation.   No wheezes, crackles, or rhonchi. No acute distress. Heart:  Regular rate and rhythm; no murmurs, clicks, rubs, or gallops. Abdomen:  Normal bowel sounds.  No bruits.  Soft, and non-distended without masses, hepatosplenomegaly or hernias noted.  No Tenderness.  No guarding or rebound tenderness.    Neurologic:  Alert and oriented x3;  grossly normal neurologically. Psych:  Alert and cooperative. Normal mood and affect.  Imaging Studies: No results found.  Assessment and Plan:   Johnny Newman is a 67 y.o. y/o male has been referred for:   1.  Chronic GERD  Continue omeprazole 20 Mg once daily  and OTC antacid as needed.  Recommend Lifestyle Modifications to prevent Acid Reflux.  Rec. Avoid coffee, sodas, peppermint, citrus fruits, and spicey foods.  Avoid eating 2-3 hours before bedtime.   2.  Dysphagia Scheduling EGD with Dilation I discussed risks of EGD with patient to include risk of bleeding, perforation, and risk of sedation.  Patient expressed understanding and agrees to proceed with EGD.   3.  Colon cancer screening  Scheduling Colonoscopy I discussed risks of colonoscopy with patient to include risk of bleeding, colon perforation, and risk of sedation.  Patient expressed understanding and agrees to proceed with colonoscopy.    Follow up as needed based on EGD / Colonoscopy results and GI symptoms.  Celso Amy, PA-C

## 2022-11-09 ENCOUNTER — Encounter: Payer: Self-pay | Admitting: Physician Assistant

## 2022-11-09 ENCOUNTER — Ambulatory Visit: Payer: Medicare HMO | Admitting: Physician Assistant

## 2022-11-09 VITALS — BP 141/76 | HR 66 | Temp 98.1°F | Ht 68.0 in | Wt 191.0 lb

## 2022-11-09 DIAGNOSIS — K219 Gastro-esophageal reflux disease without esophagitis: Secondary | ICD-10-CM | POA: Diagnosis not present

## 2022-11-09 DIAGNOSIS — Z1211 Encounter for screening for malignant neoplasm of colon: Secondary | ICD-10-CM

## 2022-11-09 DIAGNOSIS — R1310 Dysphagia, unspecified: Secondary | ICD-10-CM | POA: Diagnosis not present

## 2022-11-09 MED ORDER — PEG 3350-KCL-NA BICARB-NACL 420 G PO SOLR: 4000.0000 mL | Freq: Once | ORAL | 0 refills | Status: AC

## 2022-11-20 ENCOUNTER — Other Ambulatory Visit: Payer: Self-pay | Admitting: Podiatry

## 2022-12-06 ENCOUNTER — Encounter: Payer: Self-pay | Admitting: Podiatry

## 2022-12-06 ENCOUNTER — Ambulatory Visit (INDEPENDENT_AMBULATORY_CARE_PROVIDER_SITE_OTHER): Payer: Medicare HMO | Admitting: Podiatry

## 2022-12-06 VITALS — Ht 68.0 in | Wt 191.0 lb

## 2022-12-06 DIAGNOSIS — M722 Plantar fascial fibromatosis: Secondary | ICD-10-CM | POA: Diagnosis not present

## 2022-12-06 NOTE — Progress Notes (Signed)
   Chief Complaint  Patient presents with   Routine Post Op    Patient is here for F/U for left foot, does well most of the time insoles help but when he removes shoes has swelling    Subjective:  Patient presents today status post EPF left foot.  DOS: 04/14/2022.  Patient states that he is about 70% improved.  He did complete a functional capacity evaluation last week.  I do not have access to those results.  He says that during the day when he wears his orthotics he has no pain however immediately when he takes his shoes off he has some swelling and stretching along the plantar surface of the foot.  He attributes this to possible scar tissue.  No new complaints  History reviewed. No pertinent past medical history.  Past Surgical History:  Procedure Laterality Date   CORONARY ANGIOPLASTY WITH STENT PLACEMENT     post EPF left foot.  DOS: 04/14/2022. Left 04/14/2022    No Known Allergies  Objective/Physical Exam Neurovascular status intact.  Incisions nicely healed.  Chronic tenderness to the plantar heel and along the lateral column of the foot.  Muscle strength 5/5 all compartments.  Assessment: 1. s/p EPF left. DOS: 04/14/2022 2.  DJD with arthritis right knee  -Patient evaluated.  -Continue custom molded orthotics -Continue NSAIDs daily as needed -Currently I do not see any benefit of MRI -From a surgical/podiatry standpoint believe he is reached maximum medical improvement. -Patient may return to work full activity with no restrictions per FCE Return to clinic as needed   *Workers[employer] Rep [employer] RN BSN QRP, Mikael Spray, DPM [facility]  Dr. Felecia Shelling, DPM    [address] [place], [place] [address]                Office [phone]  Fax [phone]

## 2022-12-14 ENCOUNTER — Encounter: Payer: Self-pay | Admitting: Gastroenterology

## 2022-12-15 ENCOUNTER — Encounter
Admission: RE | Disposition: A | Discharge status: Home / Self Care | Payer: Self-pay | Source: Ambulatory Visit | Attending: Gastroenterology

## 2022-12-15 ENCOUNTER — Encounter: Payer: Self-pay | Admitting: Gastroenterology

## 2022-12-15 ENCOUNTER — Ambulatory Visit: Payer: Medicare HMO | Admitting: Certified Registered"

## 2022-12-15 ENCOUNTER — Ambulatory Visit
Admission: RE | Admit: 2022-12-15 | Discharge: 2022-12-15 | Disposition: A | Discharge status: Home / Self Care | Payer: Medicare HMO | Source: Ambulatory Visit | Attending: Gastroenterology | Admitting: Gastroenterology

## 2022-12-15 DIAGNOSIS — D125 Benign neoplasm of sigmoid colon: Secondary | ICD-10-CM | POA: Diagnosis not present

## 2022-12-15 DIAGNOSIS — K2960 Other gastritis without bleeding: Secondary | ICD-10-CM | POA: Insufficient documentation

## 2022-12-15 DIAGNOSIS — K2970 Gastritis, unspecified, without bleeding: Secondary | ICD-10-CM

## 2022-12-15 DIAGNOSIS — K219 Gastro-esophageal reflux disease without esophagitis: Secondary | ICD-10-CM | POA: Diagnosis not present

## 2022-12-15 DIAGNOSIS — D123 Benign neoplasm of transverse colon: Secondary | ICD-10-CM | POA: Insufficient documentation

## 2022-12-15 DIAGNOSIS — D126 Benign neoplasm of colon, unspecified: Secondary | ICD-10-CM

## 2022-12-15 DIAGNOSIS — K2210 Ulcer of esophagus without bleeding: Secondary | ICD-10-CM | POA: Insufficient documentation

## 2022-12-15 DIAGNOSIS — R1319 Other dysphagia: Secondary | ICD-10-CM

## 2022-12-15 DIAGNOSIS — D122 Benign neoplasm of ascending colon: Secondary | ICD-10-CM | POA: Diagnosis not present

## 2022-12-15 DIAGNOSIS — K635 Polyp of colon: Secondary | ICD-10-CM

## 2022-12-15 DIAGNOSIS — K2289 Other specified disease of esophagus: Secondary | ICD-10-CM | POA: Diagnosis not present

## 2022-12-15 DIAGNOSIS — K5730 Diverticulosis of large intestine without perforation or abscess without bleeding: Secondary | ICD-10-CM | POA: Diagnosis not present

## 2022-12-15 DIAGNOSIS — Z1211 Encounter for screening for malignant neoplasm of colon: Secondary | ICD-10-CM | POA: Diagnosis not present

## 2022-12-15 DIAGNOSIS — K2100 Gastro-esophageal reflux disease with esophagitis, without bleeding: Secondary | ICD-10-CM | POA: Diagnosis not present

## 2022-12-15 HISTORY — PX: BIOPSY: SHX5522

## 2022-12-15 HISTORY — PX: ESOPHAGOGASTRODUODENOSCOPY (EGD) WITH PROPOFOL: SHX5813

## 2022-12-15 HISTORY — PX: POLYPECTOMY: SHX5525

## 2022-12-15 HISTORY — PX: HEMOSTASIS CLIP PLACEMENT: SHX6857

## 2022-12-15 HISTORY — DX: Atherosclerotic heart disease of native coronary artery without angina pectoris: I25.10

## 2022-12-15 HISTORY — PX: COLONOSCOPY WITH PROPOFOL: SHX5780

## 2022-12-15 SURGERY — COLONOSCOPY WITH PROPOFOL
Anesthesia: General

## 2022-12-15 MED ORDER — PROPOFOL 1000 MG/100ML IV EMUL
INTRAVENOUS | Status: AC
  Filled 2022-12-15: qty 100

## 2022-12-15 MED ORDER — PHENYLEPHRINE 80 MCG/ML (10ML) SYRINGE FOR IV PUSH (FOR BLOOD PRESSURE SUPPORT)
PREFILLED_SYRINGE | INTRAVENOUS | Status: DC | PRN
  Administered 2022-12-15: 80 ug via INTRAVENOUS

## 2022-12-15 MED ORDER — OMEPRAZOLE MAGNESIUM 20 MG PO TBEC: 40.0000 mg | DELAYED_RELEASE_TABLET | Freq: Two times a day (BID) | ORAL | 0 refills | Status: AC

## 2022-12-15 MED ORDER — MIDAZOLAM HCL 2 MG/2ML IJ SOLN
INTRAMUSCULAR | Status: AC
Start: 2022-12-15 — End: ?
  Filled 2022-12-15: qty 2

## 2022-12-15 MED ORDER — PROPOFOL 10 MG/ML IV BOLUS
INTRAVENOUS | Status: DC | PRN
  Administered 2022-12-15: 70 mg via INTRAVENOUS
  Administered 2022-12-15 (×2): 30 mg via INTRAVENOUS

## 2022-12-15 MED ORDER — MIDAZOLAM HCL 2 MG/2ML IJ SOLN
INTRAMUSCULAR | Status: DC | PRN
  Administered 2022-12-15: 2 mg via INTRAVENOUS

## 2022-12-15 MED ORDER — SODIUM CHLORIDE 0.9 % IV SOLN: INTRAVENOUS | Status: DC | PRN

## 2022-12-15 MED ORDER — LIDOCAINE HCL (CARDIAC) PF 100 MG/5ML IV SOSY
PREFILLED_SYRINGE | INTRAVENOUS | Status: DC | PRN
  Administered 2022-12-15: 100 mg via INTRAVENOUS

## 2022-12-15 MED ORDER — PROPOFOL 500 MG/50ML IV EMUL
INTRAVENOUS | Status: DC | PRN
  Administered 2022-12-15: 185 ug/kg/min via INTRAVENOUS

## 2022-12-15 MED ORDER — GLYCOPYRROLATE 0.2 MG/ML IJ SOLN
INTRAMUSCULAR | Status: DC | PRN
  Administered 2022-12-15: .2 mg via INTRAVENOUS

## 2022-12-15 NOTE — H&P (Signed)
Johnny Mood, MD [address], [place], [place], [address] [address], [place], [place], [address] Phone: [phone]  Fax: [phone]  Primary Care Physician:  Lorn Junes, FNP (Inactive)   Pre-Procedure History & Physical: HPI:  Johnny Newman is a 67 y.o. male is here for an endoscopy and colonoscopy    Past Medical History:  Diagnosis Date   Coronary artery disease     Past Surgical History:  Procedure Laterality Date   CORONARY ANGIOPLASTY WITH STENT PLACEMENT     post EPF left foot.  DOS: 04/14/2022. Left 04/14/2022    Prior to Admission medications   Medication Sig Start Date End Date Taking? Authorizing Provider  Cholecalciferol (D 1000) 25 MCG (1000 UT) capsule  06/06/19  Yes [provider]  cyanocobalamin (VITAMIN B12) 1000 MCG tablet Take by mouth.   Yes [provider]  hydrochlorothiazide (MICROZIDE) 12.5 MG capsule Take 12.5 mg by mouth daily.   Yes [provider]  losartan (COZAAR) 25 MG tablet Take 25 mg by mouth daily.   Yes [provider]  losartan-hydrochlorothiazide (HYZAAR) 100-12.5 MG tablet Take 1 tablet by mouth daily.   Yes [provider]  meloxicam (MOBIC) 15 MG tablet TAKE 1 TABLET (15 MG TOTAL) BY MOUTH DAILY. 11/20/22  Yes Standiford, Jenelle Mages, DPM  Multiple Vitamin (MULTI-VITAMIN) tablet Take by mouth.   Yes [provider]  Saw Palmetto 450 MG CAPS Take by mouth.   Yes [provider]  Turmeric (QC TUMERIC COMPLEX PO) Take by mouth.   Yes [provider]  zinc gluconate 50 MG tablet Take 50 mg by mouth daily.   Yes [provider]  omeprazole (PRILOSEC) 20 MG capsule OMEPRAZOLE DR 20 MG CAPSULE 07/23/18   [provider]    Allergies as of 11/09/2022   (No Known Allergies)    History reviewed. No pertinent family history.  Social History   Socioeconomic History   Marital status: Married    Spouse name: Not  on file   Number of children: Not on file   Years of education: Not on file   Highest education level: Not on file  Occupational History   Not on file  Tobacco Use   Smoking status: Never   Smokeless tobacco: Never  Vaping Use   Vaping status: Never Used  Substance and Sexual Activity   Alcohol use: No   Drug use: No   Sexual activity: Yes    Birth control/protection: None  Other Topics Concern   Not on file  Social History Narrative   Not on file   Social Determinants of Health   Financial Resource Strain: Not on file  Food Insecurity: Not on file  Transportation Needs: Not on file  Physical Activity: Not on file  Stress: Not on file  Social Connections: Not on file  Intimate Partner Violence: Not on file    Review of Systems: See HPI, otherwise negative ROS  Physical Exam: BP (!) 157/98   Pulse 79   Temp 97.8 F (36.6 C) (Temporal)   Resp 18   Ht 5\' 8"  (1.727 m)   Wt 84.5 kg   SpO2 99%   BMI 28.34 kg/m  General:   Alert,  pleasant and cooperative in NAD Head:  Normocephalic and atraumatic. Neck:  Supple; no masses or thyromegaly. Lungs:  Clear throughout to auscultation, normal respiratory effort.    Heart:  +S1, +S2, Regular rate and rhythm, No edema.  Abdomen:  Soft, nontender and nondistended. Normal bowel sounds, without guarding, and without rebound.   Neurologic:  Alert and  oriented x4;  grossly normal neurologically.  Impression/Plan: Johnny Newman is here for an endoscopy and colonoscopy  to be performed for  evaluation of dysphagia and colon cancer screening     Risks, benefits, limitations, and alternatives regarding endoscopy have been reviewed with the patient.  Questions have been answered.  All parties agreeable.   Johnny Mood, MD  12/15/2022, 9:06 AM

## 2022-12-15 NOTE — Transfer of Care (Signed)
Immediate Anesthesia Transfer of Care Note  Patient: Johnny Newman  Procedure(s) Performed: COLONOSCOPY WITH PROPOFOL ESOPHAGOGASTRODUODENOSCOPY (EGD) WITH PROPOFOL BIOPSY POLYPECTOMY HEMOSTASIS CLIP PLACEMENT  Patient Location: Endoscopy Unit  Anesthesia Type:General  Level of Consciousness: drowsy and patient cooperative  Airway & Oxygen Therapy: Patient Spontanous Breathing and Patient connected to face mask oxygen  Post-op Assessment: Report given to RN and Post -op Vital signs reviewed and stable  Post vital signs: Reviewed and stable  Last Vitals:  Vitals Value Taken Time  BP 101/76 12/15/22 1034  Temp 36.6 C 12/15/22 1034  Pulse 89 12/15/22 1036  Resp 27 12/15/22 1036  SpO2 96 % 12/15/22 1036  Vitals shown include unfiled device data.  Last Pain:  Vitals:   12/15/22 1034  TempSrc: Temporal  PainSc: Asleep         Complications: No notable events documented.

## 2022-12-15 NOTE — Anesthesia Postprocedure Evaluation (Signed)
Anesthesia Post Note  Patient: Johnny Newman  Procedure(s) Performed: COLONOSCOPY WITH PROPOFOL ESOPHAGOGASTRODUODENOSCOPY (EGD) WITH PROPOFOL BIOPSY POLYPECTOMY HEMOSTASIS CLIP PLACEMENT  Patient location during evaluation: Endoscopy Anesthesia Type: General Level of consciousness: awake and alert Pain management: pain level controlled Vital Signs Assessment: post-procedure vital signs reviewed and stable Respiratory status: spontaneous breathing, nonlabored ventilation, respiratory function stable and patient connected to nasal cannula oxygen Cardiovascular status: blood pressure returned to baseline and stable Postop Assessment: no apparent nausea or vomiting Anesthetic complications: no  No notable events documented.   Last Vitals:  Vitals:   12/15/22 1054 12/15/22 1104  BP: 131/80 (!) 146/70  Pulse: 78 79  Resp: 20 19  Temp:    SpO2: 94% 96%    Last Pain:  Vitals:   12/15/22 1104  TempSrc:   PainSc: 0-No pain                 Stephanie Coup

## 2022-12-15 NOTE — Op Note (Signed)
[facility] Patient Name: Johnny Newman Procedure Date: 12/15/2022 9:53 AM MRN: [id number] Account #: [id number] Date of Birth: 1955/06/25 Admit Type: Outpatient Age: 67 Room: [facility] ENDO ROOM 2 Gender: Male Note Status: Finalized Instrument Name: Prentice Docker [id number] Procedure:             Colonoscopy Indications:           Screening for colorectal malignant neoplasm Providers:             Wyline Mood MD, MD Referring MD:          No Local Md, MD (Referring MD) Medicines:             Monitored Anesthesia Care Complications:         No immediate complications. Procedure:             Pre-Anesthesia Assessment:                        - Prior to the procedure, a History and Physical was                         performed, and patient medications, allergies and                         sensitivities were reviewed. The patient's tolerance                         of previous anesthesia was reviewed.                        - The risks and benefits of the procedure and the                         sedation options and risks were discussed with the                         patient. All questions were answered and informed                         consent was obtained.                        - ASA Grade Assessment: II - A patient with mild                         systemic disease.                        After obtaining informed consent, the colonoscope was                         passed under direct vision. Throughout the procedure,                         the patient's blood pressure, pulse, and oxygen                         saturations were monitored continuously. The                         Colonoscope was  introduced through the anus and                         advanced to the the cecum, identified by the                         appendiceal orifice. The colonoscopy was performed                         with ease. The patient tolerated the procedure well.                          The quality of the bowel preparation was adequate.                         Anatomical landmarks were photographed. Findings:      The perianal and digital rectal examinations were normal.      Multiple medium-mouthed diverticula were found in the sigmoid colon.      Two sessile polyps were found in the sigmoid colon. The polyps were 4 to       5 mm in size. These polyps were removed with a cold snare. Resection and       retrieval were complete.      A 5 mm polyp was found in the transverse colon. The polyp was sessile.       The polyp was removed with a cold snare. Resection and retrieval were       complete.      A 3 mm polyp was found in the ascending colon. The polyp was sessile.       The polyp was removed with a jumbo cold forceps. Resection and retrieval       were complete.      Two sessile polyps were found in the ascending colon. The polyps were 6       to 9 mm in size. These polyps were removed with a cold snare. Resection       and retrieval were complete. To prevent bleeding post-intervention, one       hemostatic clip was successfully placed. Clip manufacturer: [employer]. There was no bleeding at the end of the procedure.      The exam was otherwise without abnormality on direct and retroflexion       views. Impression:            - Diverticulosis in the sigmoid colon.                        - Two 4 to 5 mm polyps in the sigmoid colon, removed                         with a cold snare. Resected and retrieved.                        - One 5 mm polyp in the transverse colon, removed with                         a cold snare. Resected and retrieved.                        -  One 3 mm polyp in the ascending colon, removed with                         a jumbo cold forceps. Resected and retrieved.                        - Two 6 to 9 mm polyps in the ascending colon, removed                         with a cold snare. Resected and retrieved. Clip  was                         placed. Clip manufacturer: [employer].                        - The examination was otherwise normal on direct and                         retroflexion views. Recommendation:        - Discharge patient to home (with escort).                        - Resume previous diet.                        - Continue present medications.                        - Await pathology results.                        - Repeat colonoscopy in 3 years for surveillance. Procedure Code(s):     --- Professional ---                        [phone], Colonoscopy, flexible; with removal of                         tumor(s), polyp(s), or other lesion(s) by snare                         technique                        45380, 59, Colonoscopy, flexible; with biopsy, single                         or multiple Diagnosis Code(s):     --- Professional ---                        Z12.11, Encounter for screening for malignant neoplasm                         of colon                        D12.5, Benign neoplasm of sigmoid colon                        D12.2, Benign neoplasm of ascending colon  D12.3, Benign neoplasm of transverse colon (hepatic                         flexure or splenic flexure)                        K57.30, Diverticulosis of large intestine without                         perforation or abscess without bleeding CPT copyright 2022 American [employer] Association. All rights reserved. The codes documented in this report are preliminary and upon coder review may  be revised to meet current compliance requirements. Wyline Mood, MD Wyline Mood MD, MD 12/15/2022 10:34:02 AM This report has been signed electronically. Number of Addenda: 0 Note Initiated On: 12/15/2022 9:53 AM Scope Withdrawal Time: 0 hours 19 minutes 19 seconds  Total Procedure Duration: 0 hours 23 minutes 32 seconds  Estimated Blood Loss:  Estimated blood loss: none.      [facility]

## 2022-12-15 NOTE — OR Nursing (Signed)
Found brother, patient to be discharge home

## 2022-12-15 NOTE — Op Note (Signed)
[facility] Patient Name: Johnny Newman Procedure Date: 12/15/2022 9:54 AM MRN: [id number] Account #: [id number] Date of Birth: Dec 02, 1955 Admit Type: Outpatient Age: 67 Room: [facility] ENDO ROOM 2 Gender: Male Note Status: Finalized Instrument Name: Upper Endoscope [id number] Procedure:             Upper GI endoscopy Indications:           Dysphagia Providers:             Wyline Mood MD, MD Referring MD:          No Local Md, MD (Referring MD) Medicines:             Monitored Anesthesia Care Complications:         No immediate complications. Procedure:             Pre-Anesthesia Assessment:                        - Prior to the procedure, a History and Physical was                         performed, and patient medications, allergies and                         sensitivities were reviewed. The patient's tolerance                         of previous anesthesia was reviewed.                        - The risks and benefits of the procedure and the                         sedation options and risks were discussed with the                         patient. All questions were answered and informed                         consent was obtained.                        - ASA Grade Assessment: II - A patient with mild                         systemic disease.                        After obtaining informed consent, the endoscope was                         passed under direct vision. Throughout the procedure,                         the patient's blood pressure, pulse, and oxygen                         saturations were monitored continuously. The Endoscope                         was introduced  through the mouth, and advanced to the                         third part of duodenum. The upper GI endoscopy was                         accomplished with ease. The patient tolerated the                         procedure well. Findings:      The examined duodenum was  normal.      Localized moderate inflammation characterized by congestion (edema) and       erythema was found in the gastric antrum and on the greater curvature of       the gastric antrum. Biopsies were taken with a cold forceps for       histology.      The cardia and gastric fundus were normal on retroflexion.      Localized moderate mucosal changes characterized by congestion,       granularity, inflammation and ulceration were found at the       gastroesophageal junction. Biopsies were taken with a cold forceps for       histology. Impression:            - Normal examined duodenum.                        - Gastritis. Biopsied.                        - Congested, granular, inflamed, ulcerated mucosa in                         the esophagus. Biopsied. Recommendation:        - Await pathology results.                        - Suggest Prilosec 40 mg BID for 8 weeks and may                         require repeat egd to check for healing of esophaguitis Procedure Code(s):     --- Professional ---                        [phone], Esophagogastroduodenoscopy, flexible,                         transoral; with biopsy, single or multiple Diagnosis Code(s):     --- Professional ---                        K29.70, Gastritis, unspecified, without bleeding                        K22.89, Other specified disease of esophagus                        K20.90, Esophagitis, unspecified without bleeding                        K22.10, Ulcer of esophagus without bleeding  R13.10, Dysphagia, unspecified CPT copyright 2022 American Medical Association. All rights reserved. The codes documented in this report are preliminary and upon coder review may  be revised to meet current compliance requirements. Wyline Mood, MD Wyline Mood MD, MD 12/15/2022 10:06:12 AM This report has been signed electronically. Number of Addenda: 0 Note Initiated On: 12/15/2022 9:54 AM Estimated Blood Loss:  Estimated  blood loss: none.      [facility]: Lambert Rhonda W

## 2022-12-15 NOTE — OR Nursing (Signed)
Trying to find/ call his brother for his transportation no answer

## 2022-12-15 NOTE — Anesthesia Procedure Notes (Signed)
Procedure Name: General with mask airway Date/Time: 12/15/2022 9:57 AM  Performed by: Mohammed Kindle, CRNAPre-anesthesia Checklist: Patient identified, Suction available, Emergency Drugs available and Patient being monitored Patient Re-evaluated:Patient Re-evaluated prior to induction Oxygen Delivery Method: Simple face mask Placement Confirmation: positive ETCO2, CO2 detector and breath sounds checked- equal and bilateral Dental Injury: Teeth and Oropharynx as per pre-operative assessment

## 2022-12-15 NOTE — Anesthesia Preprocedure Evaluation (Signed)
Anesthesia Evaluation  Patient identified by MRN, date of birth, ID band Patient awake    Reviewed: Allergy & Precautions, NPO status , Patient's Chart, lab work & pertinent test results  Airway Mallampati: III  TM Distance: >3 FB Neck ROM: full    Dental  (+) Chipped   Pulmonary neg pulmonary ROS   Pulmonary exam normal        Cardiovascular + CAD and + Cardiac Stents  Normal cardiovascular exam     Neuro/Psych negative neurological ROS  negative psych ROS   GI/Hepatic negative GI ROS, Neg liver ROS,,,  Endo/Other  negative endocrine ROS    Renal/GU negative Renal ROS  negative genitourinary   Musculoskeletal   Abdominal   Peds  Hematology negative hematology ROS (+)   Anesthesia Other Findings Past Medical History: No date: Coronary artery disease  Past Surgical History: No date: CORONARY ANGIOPLASTY WITH STENT PLACEMENT 04/14/2022: post EPF left foot.  DOS: 04/14/2022.; Left  BMI    Body Mass Index: 28.34 kg/m      Reproductive/Obstetrics negative OB ROS                             Anesthesia Physical Anesthesia Plan  ASA: 3  Anesthesia Plan: General   Post-op Pain Management: Minimal or no pain anticipated   Induction: Intravenous  PONV Risk Score and Plan: 3 and Propofol infusion, TIVA and Ondansetron  Airway Management Planned: Nasal Cannula  Additional Equipment: None  Intra-op Plan:   Post-operative Plan:   Informed Consent: I have reviewed the patients History and Physical, chart, labs and discussed the procedure including the risks, benefits and alternatives for the proposed anesthesia with the patient or authorized representative who has indicated his/her understanding and acceptance.     Dental advisory given  Plan Discussed with: CRNA and Surgeon  Anesthesia Plan Comments: (Discussed risks of anesthesia with patient, including possibility of  difficulty with spontaneous ventilation under anesthesia necessitating airway intervention, PONV, and rare risks such as cardiac or respiratory or neurological events, and allergic reactions. Discussed the role of CRNA in patient's perioperative care. Patient understands.)       Anesthesia Quick Evaluation

## 2022-12-16 ENCOUNTER — Encounter: Payer: Self-pay | Admitting: Gastroenterology

## 2022-12-20 LAB — SURGICAL PATHOLOGY

## 2023-01-06 ENCOUNTER — Encounter: Payer: Self-pay | Admitting: Gastroenterology

## 2023-03-03 ENCOUNTER — Other Ambulatory Visit: Payer: Self-pay | Admitting: Podiatry

## 2023-04-11 ENCOUNTER — Telehealth: Payer: Self-pay

## 2023-04-11 NOTE — Telephone Encounter (Signed)
 Left message to call office- need to schedule repeat EGD to check for healing-patient taking Prilosec -per Dr.Anna- last EGD 12/5/25suggested prilosec BID 8 weeks. Esophagitis.

## 2023-05-04 ENCOUNTER — Other Ambulatory Visit: Payer: Self-pay | Admitting: Podiatry

## 2023-07-07 ENCOUNTER — Other Ambulatory Visit: Payer: Self-pay | Admitting: Podiatry

## 2023-09-08 ENCOUNTER — Other Ambulatory Visit: Payer: Self-pay | Admitting: Podiatry
